# Patient Record
Sex: Male | Born: 1985 | Race: White | Hispanic: No | Marital: Single | Smoking: Current every day smoker
Health system: Southern US, Community
[De-identification: ages and names within clinical notes are randomized; demographics above are authoritative.]

## PROBLEM LIST (undated history)

## (undated) HISTORY — PX: OTHER SURGICAL HISTORY: SHX169

## (undated) HISTORY — PX: COLONOSCOPY: SHX174

## (undated) HISTORY — PX: APPENDECTOMY: SHX54

---

## 2008-05-08 ENCOUNTER — Emergency Department (HOSPITAL_COMMUNITY): Admission: EM | Admit: 2008-05-08 | Discharge: 2008-05-08 | Payer: Self-pay | Admitting: Emergency Medicine

## 2008-05-12 ENCOUNTER — Emergency Department (HOSPITAL_COMMUNITY): Admission: EM | Admit: 2008-05-12 | Discharge: 2008-05-12 | Payer: Self-pay | Admitting: Emergency Medicine

## 2010-08-21 IMAGING — CR DG CHEST 2V
3 series · 3 of 3 positions shown · non-contrast
Comparison: None

CLINICAL DATA: Chest pain, shortness of breath, smoker

CHEST - 2 VIEW

[view not recorded (1 of 3)]
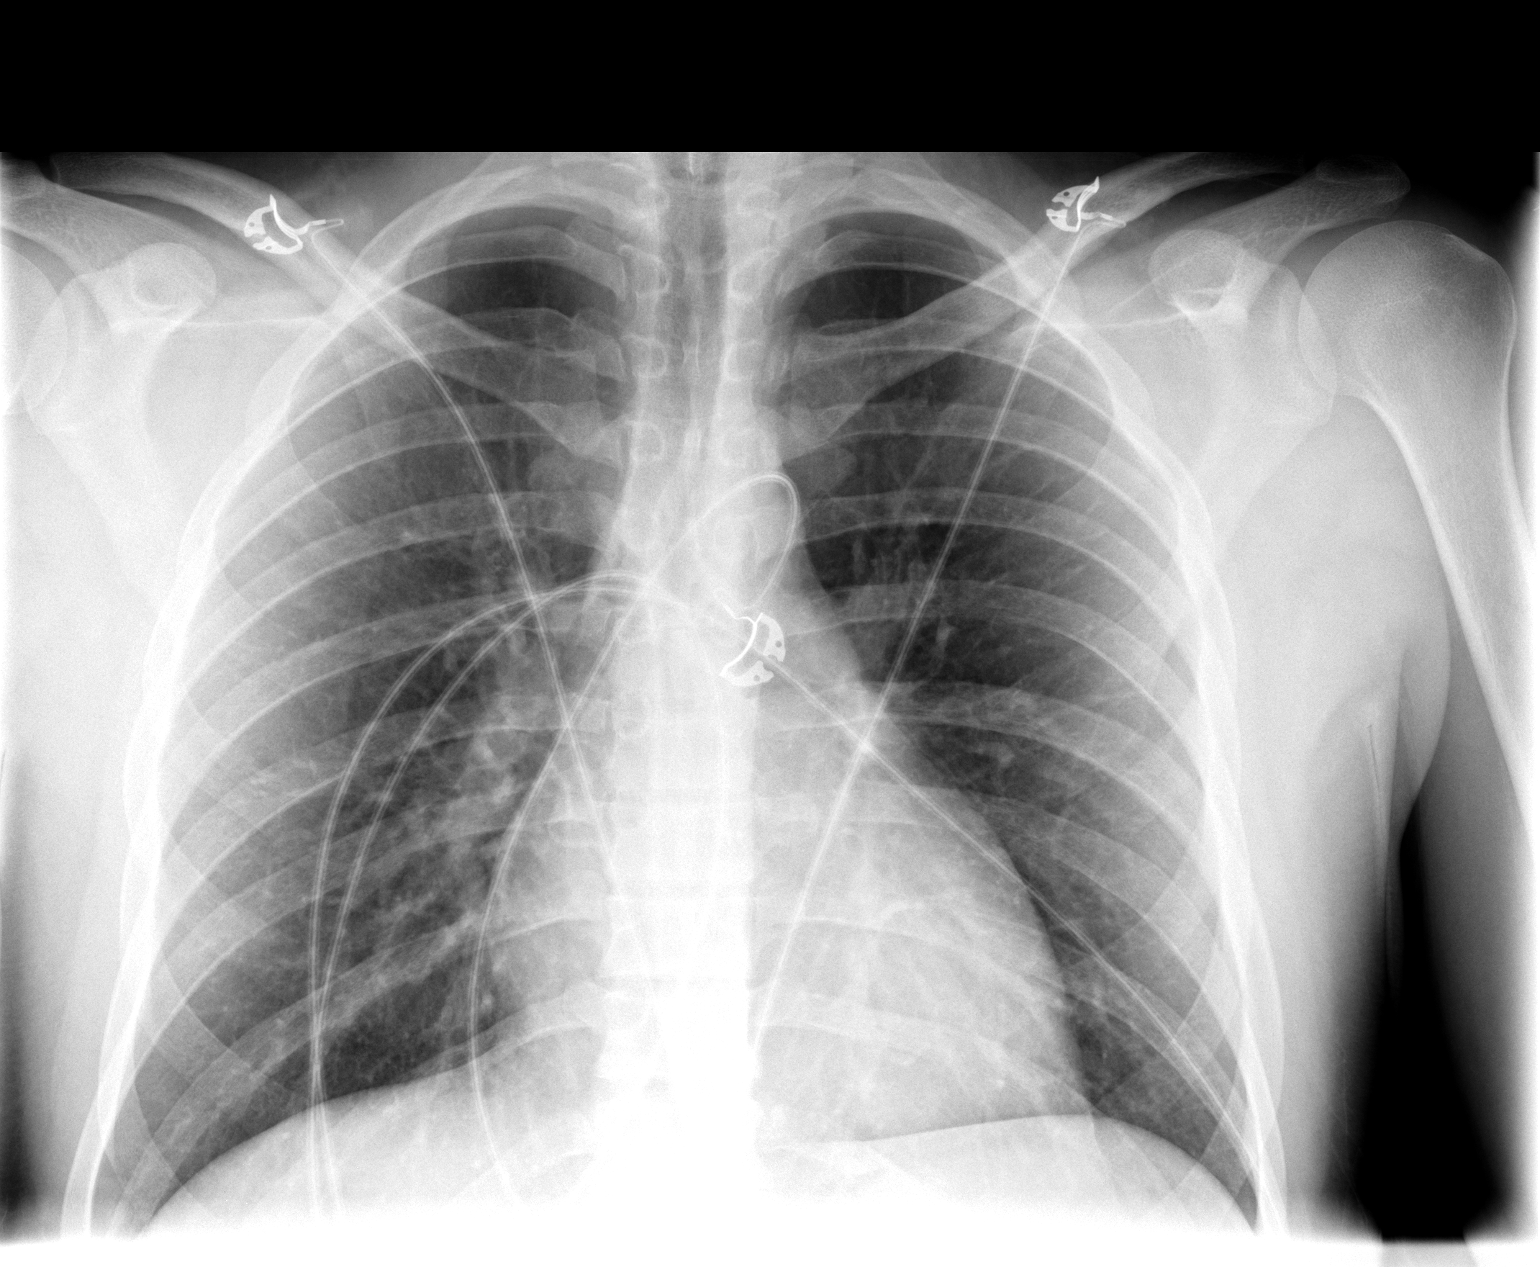

[view not recorded (2 of 3)]
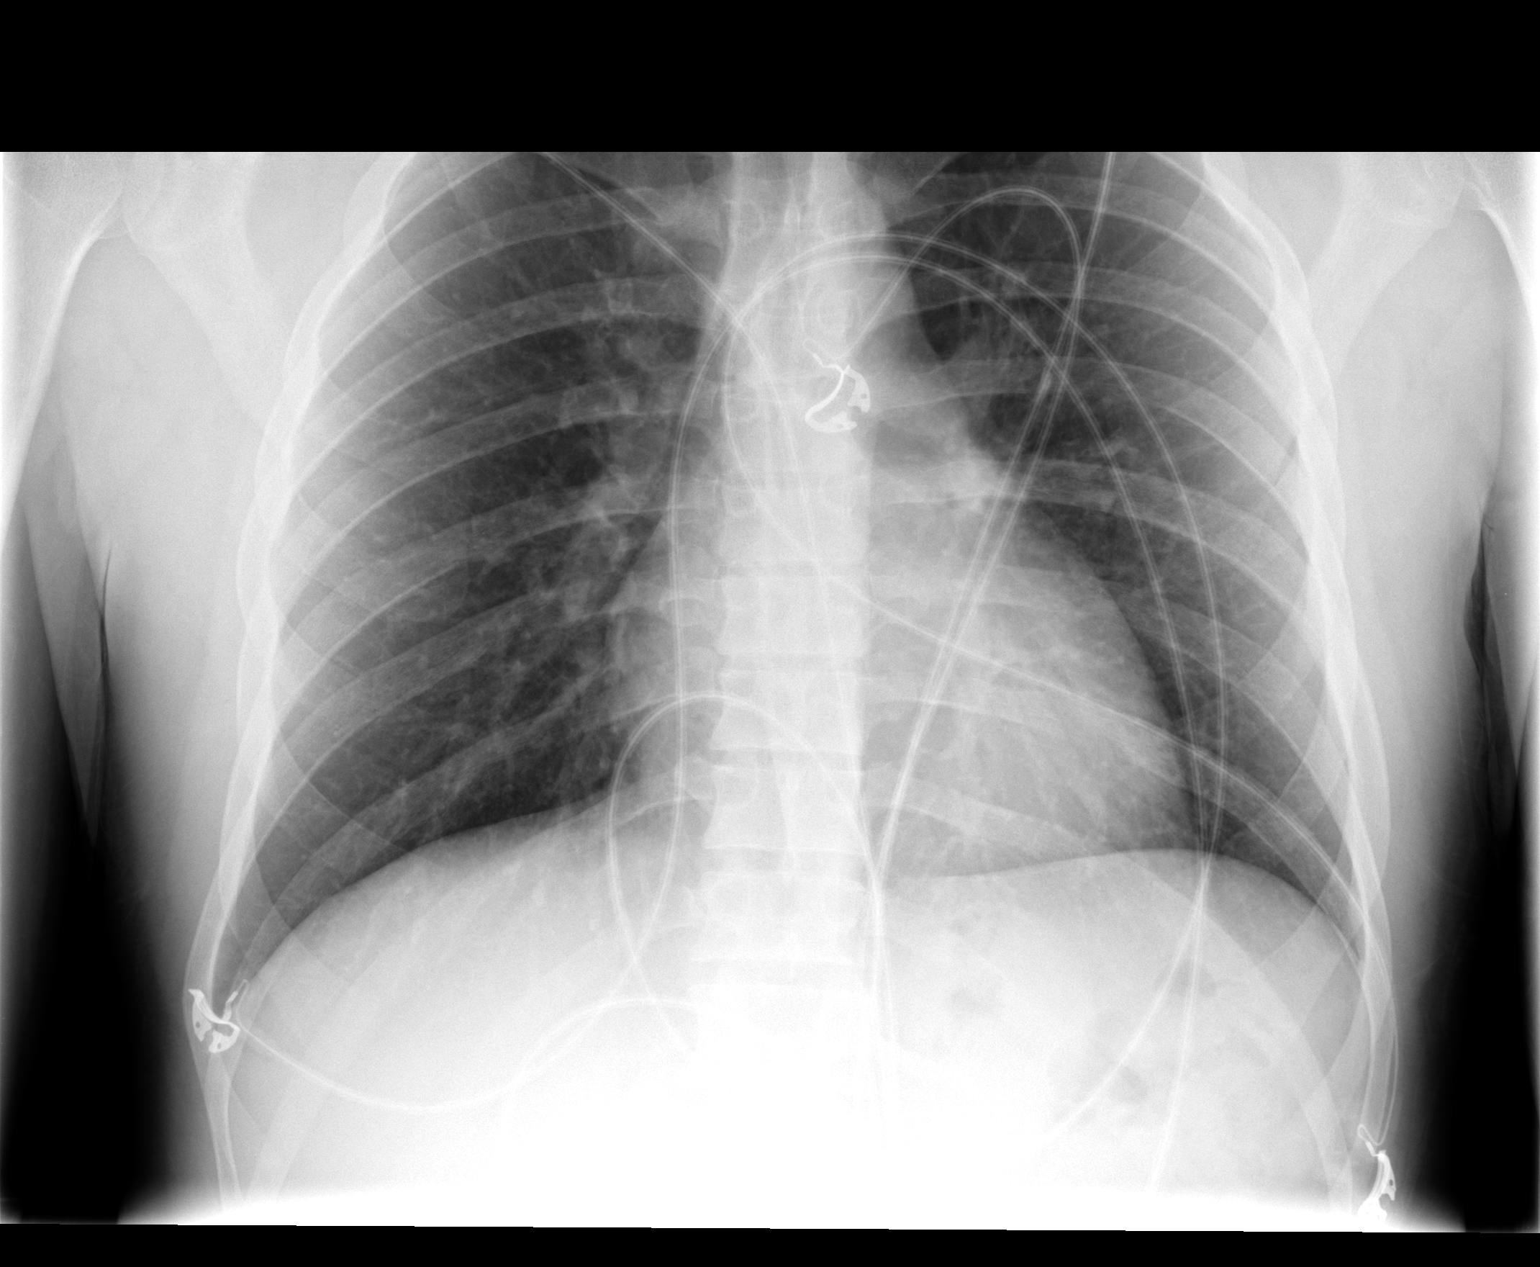

[view not recorded (3 of 3)]
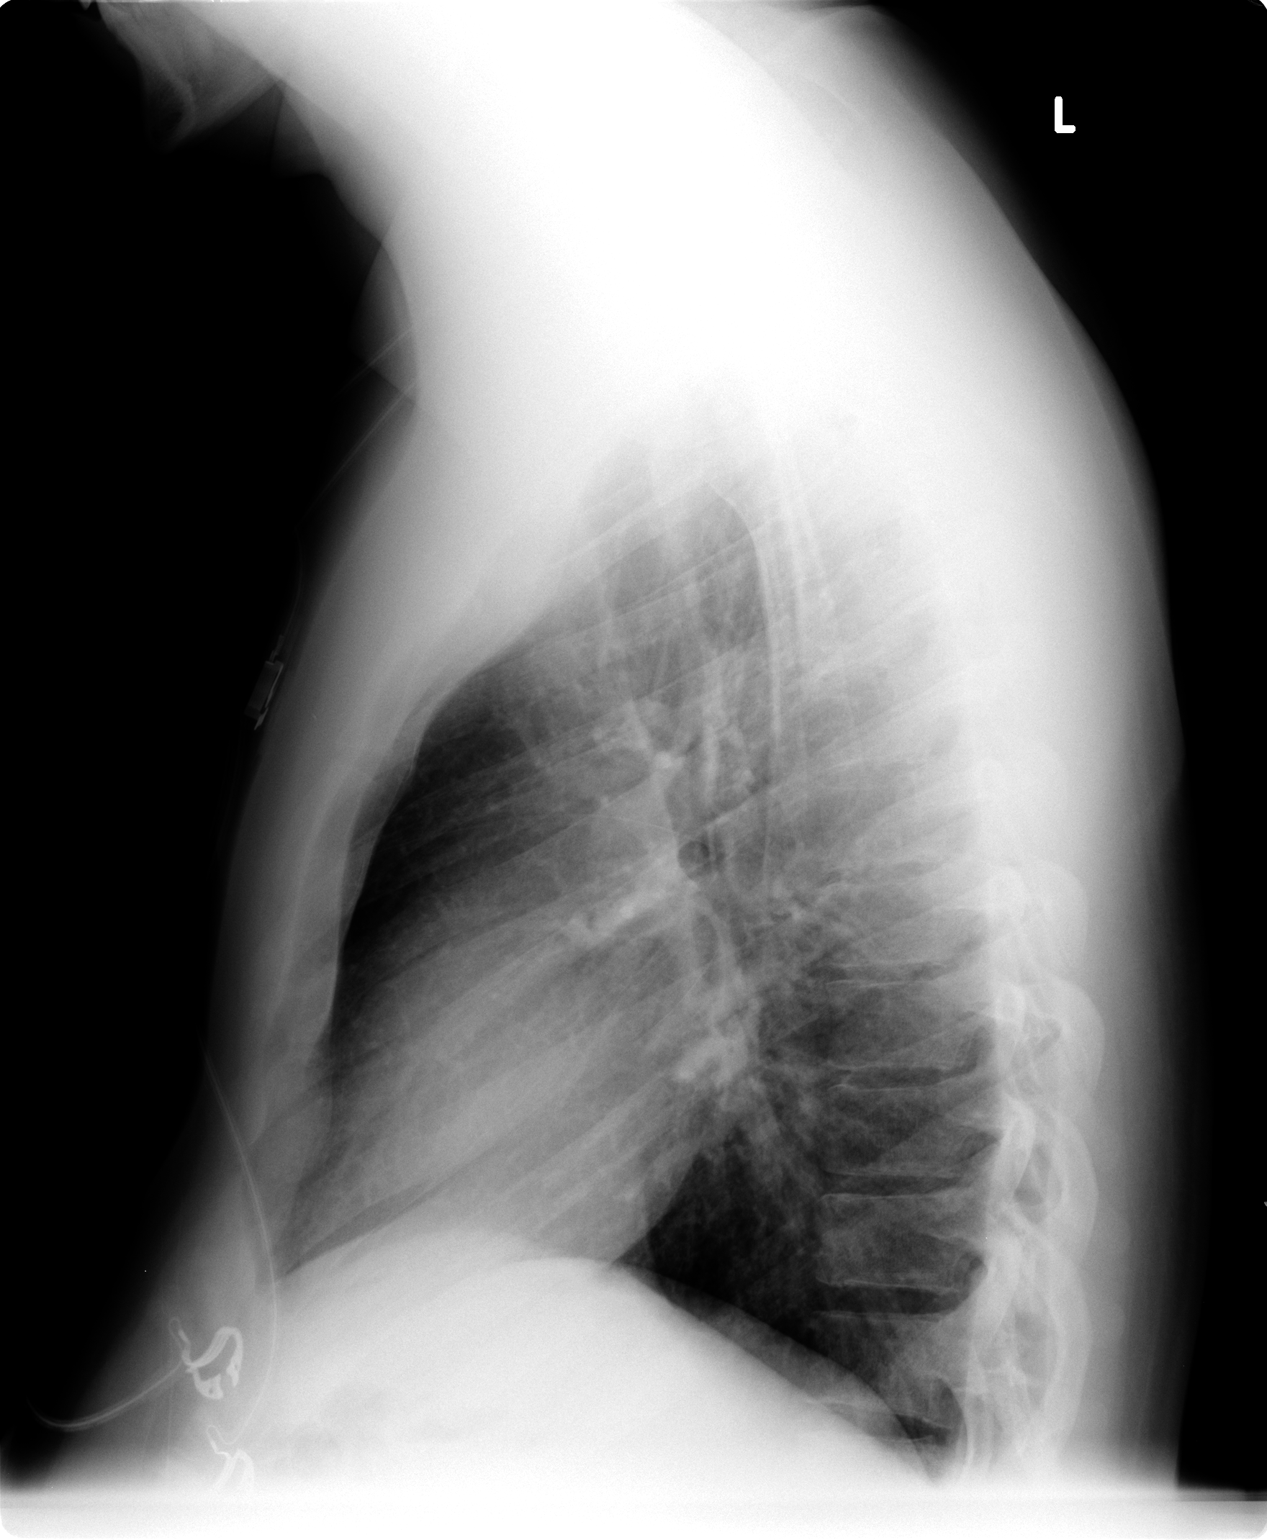

[3 of 3 positions shown; findings below may reference images not displayed]

FINDINGS: Upper normal heart size for age.
Normal mediastinal contours and pulmonary vascularity.
Lungs clear.
No pleural effusion or pneumothorax.
Bones unremarkable.
IMPRESSION: Upper normal heart size.
No acute abnormalities.

## 2012-05-10 ENCOUNTER — Encounter (HOSPITAL_COMMUNITY): Payer: Self-pay | Admitting: *Deleted

## 2012-05-10 ENCOUNTER — Emergency Department (HOSPITAL_COMMUNITY)
Admission: EM | Admit: 2012-05-10 | Discharge: 2012-05-10 | Disposition: A | Discharge status: Home / Self Care | Payer: Self-pay | Attending: Emergency Medicine | Admitting: Emergency Medicine

## 2012-05-10 DIAGNOSIS — M2550 Pain in unspecified joint: Secondary | ICD-10-CM | POA: Insufficient documentation

## 2012-05-10 DIAGNOSIS — K029 Dental caries, unspecified: Secondary | ICD-10-CM | POA: Insufficient documentation

## 2012-05-10 DIAGNOSIS — K0889 Other specified disorders of teeth and supporting structures: Secondary | ICD-10-CM

## 2012-05-10 DIAGNOSIS — F17200 Nicotine dependence, unspecified, uncomplicated: Secondary | ICD-10-CM | POA: Insufficient documentation

## 2012-05-10 DIAGNOSIS — K089 Disorder of teeth and supporting structures, unspecified: Secondary | ICD-10-CM | POA: Insufficient documentation

## 2012-05-10 DIAGNOSIS — R6884 Jaw pain: Secondary | ICD-10-CM | POA: Insufficient documentation

## 2012-05-10 DIAGNOSIS — Z8781 Personal history of (healed) traumatic fracture: Secondary | ICD-10-CM | POA: Insufficient documentation

## 2012-05-10 MED ORDER — AMOXICILLIN 500 MG PO CAPS: 500.0000 mg | ORAL_CAPSULE | Freq: Three times a day (TID) | ORAL | Status: DC

## 2012-05-10 MED ORDER — KETOROLAC TROMETHAMINE 10 MG PO TABS
10.0000 mg | ORAL_TABLET | Freq: Once | ORAL | Status: AC
  Administered 2012-05-10: 10 mg via ORAL
  Filled 2012-05-10: qty 1

## 2012-05-10 MED ORDER — MELOXICAM 7.5 MG PO TABS: ORAL_TABLET | ORAL | Status: DC

## 2012-05-10 MED ORDER — PENICILLIN V POTASSIUM 250 MG PO TABS
500.0000 mg | ORAL_TABLET | Freq: Once | ORAL | Status: AC
  Administered 2012-05-10: 500 mg via ORAL
  Filled 2012-05-10: qty 2

## 2012-05-10 MED ORDER — HYDROCODONE-ACETAMINOPHEN 5-325 MG PO TABS
2.0000 | ORAL_TABLET | Freq: Once | ORAL | Status: AC
  Administered 2012-05-10: 2 via ORAL
  Filled 2012-05-10: qty 2

## 2012-05-10 MED ORDER — ONDANSETRON HCL 4 MG PO TABS
4.0000 mg | ORAL_TABLET | Freq: Once | ORAL | Status: AC
  Administered 2012-05-10: 4 mg via ORAL
  Filled 2012-05-10: qty 1

## 2012-05-10 MED ORDER — HYDROCODONE-ACETAMINOPHEN 5-325 MG PO TABS: 1.0000 | ORAL_TABLET | ORAL | Status: DC | PRN

## 2012-05-10 NOTE — ED Provider Notes (Signed)
History     CSN: [id number]  Arrival date & time 05/10/12  2137   None     Chief Complaint  Patient presents with  . Dental Pain    (Consider location/radiation/quality/duration/timing/severity/associated sxs/prior treatment) Patient is a 27 y.o. male presenting with tooth pain. The history is provided by the patient.  Dental PainThe primary symptoms include mouth pain. Primary symptoms do not include shortness of breath or cough. The symptoms began 3 to 5 days ago. The symptoms are worsening. The symptoms occur frequently.  Additional symptoms include: gum swelling and jaw pain. Additional symptoms do not include: trouble swallowing and nosebleeds. Medical issues include: smoking. Medical issues do not include: chewing tobacco.    History reviewed. No pertinent past medical history.  Past Surgical History  Procedure Laterality Date  . Hand fractiure    . Appendectomy    . Colonoscopy      History reviewed. No pertinent family history.  History  Substance Use Topics  . Smoking status: Current Every Day Smoker    Types: Cigarettes  . Smokeless tobacco: Not on file  . Alcohol Use: No      Review of Systems  Constitutional: Negative for activity change.       All ROS Neg except as noted in HPI  HENT: Positive for dental problem. Negative for nosebleeds, trouble swallowing and neck pain.   Eyes: Negative for photophobia and discharge.  Respiratory: Negative for cough, shortness of breath and wheezing.   Cardiovascular: Negative for chest pain and palpitations.  Gastrointestinal: Negative for abdominal pain and blood in stool.  Genitourinary: Negative for dysuria, frequency and hematuria.  Musculoskeletal: Positive for arthralgias. Negative for back pain.  Skin: Negative.   Neurological: Negative for dizziness, seizures and speech difficulty.  Psychiatric/Behavioral: Negative for hallucinations and confusion.    Allergies  Review of patient's allergies indicates no  known allergies.  Home Medications  No current outpatient prescriptions on file.  BP 130/69  Pulse 79  Temp(Src) 98 F (36.7 C) (Oral)  Resp 18  Ht 6\' 1"  (1.854 m)  Wt 200 lb (90.719 kg)  BMI 26.39 kg/m2  SpO2 100%  Physical Exam  Nursing note and vitals reviewed. Constitutional: He is oriented to person, place, and time. He appears well-developed and well-nourished.  Non-toxic appearance.  HENT:  Head: Normocephalic.  Right Ear: Tympanic membrane and external ear normal.  Left Ear: Tympanic membrane and external ear normal.  Patient has multiple dental caries present. The first molar on the left side the upper jaw is decayed to the gum line. There is swelling around the gum in this area. The lower left second molar and first molar have deep cavities present. There is mild swelling of the gum on the side. There is no visible abscess of the upper or lower gum. The airway is patent. There is no swelling under the tongue.  Eyes: EOM and lids are normal. Pupils are equal, round, and reactive to light.  Neck: Normal range of motion. Neck supple. Carotid bruit is not present.  Cardiovascular: Normal rate, regular rhythm, normal heart sounds, intact distal pulses and normal pulses.   Pulmonary/Chest: Breath sounds normal. No respiratory distress.  Abdominal: Soft. Bowel sounds are normal. There is no tenderness. There is no guarding.  Musculoskeletal: Normal range of motion.  Lymphadenopathy:       Head (right side): No submandibular adenopathy present.       Head (left side): No submandibular adenopathy present.    He has no  cervical adenopathy.  Neurological: He is alert and oriented to person, place, and time. He has normal strength. No cranial nerve deficit or sensory deficit.  Skin: Skin is warm and dry.  Psychiatric: He has a normal mood and affect. His speech is normal.    ED Course  Procedures (including critical care time)  Labs Reviewed - No data to display No results  found.   No diagnosis found.    MDM  I have reviewed nursing notes, vital signs, and all appropriate lab and imaging results for this patient. Patient has multiple dental caries present. Vital signs are stable. Patient is given a Personnel officer. He is advised to see a dentist systems possible. Prescription for Amoxil, Norco #20 tablets, and Mobic.       Kathie Dike, PA-C 05/10/12 2241

## 2012-05-10 NOTE — ED Notes (Signed)
Pain rt side of face, says he has mult. cavities

## 2012-05-10 NOTE — ED Notes (Signed)
Alert, NAd, dental pain rt side of face.

## 2012-05-11 NOTE — ED Provider Notes (Signed)
Medical screening examination/treatment/procedure(s) were performed by non-physician practitioner and as supervising physician I was immediately available for consultation/collaboration.   Kordel Leavy L Zadia Uhde, MD 05/11/12 1550 

## 2012-05-28 ENCOUNTER — Encounter (HOSPITAL_COMMUNITY): Payer: Self-pay

## 2012-05-28 ENCOUNTER — Emergency Department (HOSPITAL_COMMUNITY): Payer: Self-pay

## 2012-05-28 ENCOUNTER — Emergency Department (HOSPITAL_COMMUNITY)
Admission: EM | Admit: 2012-05-28 | Discharge: 2012-05-28 | Disposition: A | Discharge status: Home / Self Care | Payer: Self-pay | Attending: Emergency Medicine | Admitting: Emergency Medicine

## 2012-05-28 DIAGNOSIS — R093 Abnormal sputum: Secondary | ICD-10-CM | POA: Insufficient documentation

## 2012-05-28 DIAGNOSIS — R059 Cough, unspecified: Secondary | ICD-10-CM | POA: Insufficient documentation

## 2012-05-28 DIAGNOSIS — B349 Viral infection, unspecified: Secondary | ICD-10-CM

## 2012-05-28 DIAGNOSIS — B9789 Other viral agents as the cause of diseases classified elsewhere: Secondary | ICD-10-CM | POA: Insufficient documentation

## 2012-05-28 DIAGNOSIS — R05 Cough: Secondary | ICD-10-CM | POA: Insufficient documentation

## 2012-05-28 DIAGNOSIS — F17200 Nicotine dependence, unspecified, uncomplicated: Secondary | ICD-10-CM | POA: Insufficient documentation

## 2012-05-28 DIAGNOSIS — R52 Pain, unspecified: Secondary | ICD-10-CM | POA: Insufficient documentation

## 2012-05-28 LAB — CBC WITH DIFFERENTIAL/PLATELET
Eosinophils Absolute: 0 10*3/uL (ref 0.0–0.7)
Hemoglobin: 13.8 g/dL (ref 13.0–17.0)
Lymphocytes Relative: 8 % — ABNORMAL LOW (ref 12–46)
Lymphs Abs: 1.3 10*3/uL (ref 0.7–4.0)
MCH: 30.5 pg (ref 26.0–34.0)
Monocytes Relative: 8 % (ref 3–12)
Neutrophils Relative %: 84 % — ABNORMAL HIGH (ref 43–77)
RBC: 4.53 MIL/uL (ref 4.22–5.81)
WBC: 16.5 10*3/uL — ABNORMAL HIGH (ref 4.0–10.5)

## 2012-05-28 LAB — URINALYSIS, ROUTINE W REFLEX MICROSCOPIC
Bilirubin Urine: NEGATIVE
Glucose, UA: NEGATIVE mg/dL
Hgb urine dipstick: NEGATIVE
Specific Gravity, Urine: >1.03 — ABNORMAL HIGH (ref 1.005–1.030)
pH: 5.5 (ref 5.0–8.0)

## 2012-05-28 LAB — BASIC METABOLIC PANEL
BUN: 17 mg/dL (ref 6–23)
CO2: 23 mEq/L (ref 19–32)
Chloride: 105 mEq/L (ref 96–112)
GFR calc non Af Amer: >90 mL/min (ref >90)
Glucose, Bld: 125 mg/dL — ABNORMAL HIGH (ref 70–99)
Potassium: 3.7 mEq/L (ref 3.5–5.1)

## 2012-05-28 MED ORDER — ONDANSETRON HCL 4 MG/2ML IJ SOLN
4.0000 mg | Freq: Once | INTRAMUSCULAR | Status: AC
  Administered 2012-05-28: 4 mg via INTRAVENOUS
  Filled 2012-05-28: qty 2

## 2012-05-28 MED ORDER — PROMETHAZINE HCL 25 MG PO TABS: 25.0000 mg | ORAL_TABLET | Freq: Four times a day (QID) | ORAL | Status: DC | PRN

## 2012-05-28 MED ORDER — MORPHINE SULFATE 4 MG/ML IJ SOLN
4.0000 mg | Freq: Once | INTRAMUSCULAR | Status: AC
  Administered 2012-05-28: 4 mg via INTRAVENOUS
  Filled 2012-05-28: qty 1

## 2012-05-28 MED ORDER — TRAMADOL HCL 50 MG PO TABS: 50.0000 mg | ORAL_TABLET | Freq: Four times a day (QID) | ORAL | Status: DC | PRN

## 2012-05-28 MED ORDER — ACETAMINOPHEN 500 MG PO TABS
1000.0000 mg | ORAL_TABLET | Freq: Once | ORAL | Status: AC
  Administered 2012-05-28: 1000 mg via ORAL
  Filled 2012-05-28: qty 2

## 2012-05-28 MED ORDER — SODIUM CHLORIDE 0.9 % IV BOLUS (SEPSIS)
1000.0000 mL | Freq: Once | INTRAVENOUS | Status: AC
  Administered 2012-05-28: 1000 mL via INTRAVENOUS

## 2012-05-28 NOTE — ED Provider Notes (Signed)
History     This chart was scribed for Christopher Hutching, MD, MD by Smitty Pluck, ED Scribe. The patient was seen in room APA08/AP[facility]08 and the patient's care was started at 9:24 AM.   CSN: [id number]  Arrival date & time 05/28/12  4098     Chief Complaint  Patient presents with  . Fever  . Cough  . Generalized Body Aches     The history is provided by the patient and medical records. No language interpreter was used.   AKING KLABUNDE is a 27 y.o. male who presents to the Emergency Department complaining of constant, moderate generalized body aches and chills onset 1 day ago. He states he has taken 2 ibuprofen without relief. He states he has had sick contact with friend. He reports productive cough with green sputum, fever (current ED temperature is 99.8). Pt denies nausea, vomiting, diarrhea, weakness, SOB and any other pain.   History reviewed. No pertinent past medical history.  Past Surgical History  Procedure Laterality Date  . Hand fractiure    . Appendectomy    . Colonoscopy      No family history on file.  History  Substance Use Topics  . Smoking status: Current Every Day Smoker    Types: Cigarettes  . Smokeless tobacco: Not on file  . Alcohol Use: No      Review of Systems 10 Systems reviewed and all are negative for acute change except as noted in the HPI.   Allergies  Review of patient's allergies indicates no known allergies.  Home Medications   Current Outpatient Rx  Name  Route  Sig  Dispense  Refill  . amoxicillin (AMOXIL) 500 MG capsule   Oral   Take 1 capsule (500 mg total) by mouth 3 (three) times daily.   21 capsule   0   . HYDROcodone-acetaminophen (NORCO/VICODIN) 5-325 MG per tablet   Oral   Take 1 tablet by mouth every 4 (four) hours as needed for pain.   20 tablet   0   . meloxicam (MOBIC) 7.5 MG tablet      1 po bid with food   14 tablet   0     BP 135/65  Pulse 87  Temp(Src) 99.8 F (37.7 C) (Oral)  Resp 16  SpO2  97%  Physical Exam  Nursing note and vitals reviewed. Constitutional: He is oriented to person, place, and time. He appears well-developed and well-nourished.  HENT:  Head: Normocephalic and atraumatic.  Eyes: Conjunctivae and EOM are normal. Pupils are equal, round, and reactive to light.  Neck: Normal range of motion. Neck supple.  Cardiovascular: Normal rate, regular rhythm and normal heart sounds.   Pulmonary/Chest: Effort normal and breath sounds normal.  Abdominal: Soft. Bowel sounds are normal.  Musculoskeletal: Normal range of motion.  Neurological: He is alert and oriented to person, place, and time.  Skin: Skin is warm and dry.  Psychiatric: He has a normal mood and affect.    ED Course  Procedures (including critical care time) DIAGNOSTIC STUDIES: Oxygen Saturation is 97% on room air, normal by my interpretation.    COORDINATION OF CARE: 9:28 AM Discussed ED treatment with pt and pt agrees to IV fluids and labs. 9:35 AM Ordered:  Medications  sodium chloride 0.9 % bolus 1,000 mL (not administered)  sodium chloride 0.9 % bolus 1,000 mL (1,000 mLs Intravenous New Bag/Given 05/28/12 0955)  ondansetron (ZOFRAN) injection 4 mg (4 mg Intravenous Given 05/28/12 0955)  acetaminophen (TYLENOL)  tablet 1,000 mg (1,000 mg Oral Given 05/28/12 0956)   Results for orders placed during the hospital encounter of 05/28/12  CBC WITH DIFFERENTIAL      Result Value Range   WBC 16.5 (*) 4.0 - 10.5 K/uL   RBC 4.53  4.22 - 5.81 MIL/uL   Hemoglobin 13.8  13.0 - 17.0 g/dL   HCT 40.9  81.1 - 91.4 %   MCV 89.0  78.0 - 100.0 fL   MCH 30.5  26.0 - 34.0 pg   MCHC 34.2  30.0 - 36.0 g/dL   RDW 78.2  95.6 - 21.3 %   Platelets 177  150 - 400 K/uL   Neutrophils Relative 84 (*) 43 - 77 %   Neutro Abs 13.8 (*) 1.7 - 7.7 K/uL   Lymphocytes Relative 8 (*) 12 - 46 %   Lymphs Abs 1.3  0.7 - 4.0 K/uL   Monocytes Relative 8  3 - 12 %   Monocytes Absolute 1.3 (*) 0.1 - 1.0 K/uL   Eosinophils Relative 0  0  - 5 %   Eosinophils Absolute 0.0  0.0 - 0.7 K/uL   Basophils Relative 0  0 - 1 %   Basophils Absolute 0.0  0.0 - 0.1 K/uL  BASIC METABOLIC PANEL      Result Value Range   Sodium 138  135 - 145 mEq/L   Potassium 3.7  3.5 - 5.1 mEq/L   Chloride 105  96 - 112 mEq/L   CO2 23  19 - 32 mEq/L   Glucose, Bld 125 (*) 70 - 99 mg/dL   BUN 17  6 - 23 mg/dL   Creatinine, Ser 0.86  0.50 - 1.35 mg/dL   Calcium 9.2  8.4 - 57.8 mg/dL   GFR calc non Af Amer >90  >90 mL/min   GFR calc Af Amer >90  >90 mL/min    Dg Chest 2 View  05/28/2012  *RADIOLOGY REPORT*  Clinical Data: Fever, cough, body ache  CHEST - 2 VIEW  Comparison: 05/08/2008  Findings: An cardiomediastinal silhouette is stable.  No acute infiltrate or pulmonary edema.  Mild perihilar increased bronchial markings suspicious for bronchitic changes.  Bony thorax is stable.  IMPRESSION: Mild perihilar increased bronchial markings suspicious for bronchitic changes.  No acute infiltrate or pulmonary edema.   Original Report Authenticated By: Natasha Mead, M.D.      No diagnosis found.    MDM  History and physical consistent with viral syndrome. Patient feels much better after IV hydration.  Discharge meds Phenergan 25 mg #15 and Ultram #15      I personally performed the services described in this documentation, which was scribed in my presence. The recorded information has been reviewed and is accurate.    Christopher Hutching, MD 05/28/12 1319

## 2012-05-28 NOTE — ED Notes (Signed)
Pt reports fever, body aches, and cough that began last night.  Pt denies any n/v/d.

## 2012-11-13 ENCOUNTER — Encounter (HOSPITAL_COMMUNITY): Payer: Self-pay | Admitting: Emergency Medicine

## 2012-11-13 ENCOUNTER — Emergency Department (HOSPITAL_COMMUNITY)
Admission: EM | Admit: 2012-11-13 | Discharge: 2012-11-13 | Disposition: A | Discharge status: Home / Self Care | Payer: Self-pay | Attending: Emergency Medicine | Admitting: Emergency Medicine

## 2012-11-13 DIAGNOSIS — F17200 Nicotine dependence, unspecified, uncomplicated: Secondary | ICD-10-CM | POA: Insufficient documentation

## 2012-11-13 DIAGNOSIS — Z792 Long term (current) use of antibiotics: Secondary | ICD-10-CM | POA: Insufficient documentation

## 2012-11-13 DIAGNOSIS — K0889 Other specified disorders of teeth and supporting structures: Secondary | ICD-10-CM

## 2012-11-13 DIAGNOSIS — K089 Disorder of teeth and supporting structures, unspecified: Secondary | ICD-10-CM | POA: Insufficient documentation

## 2012-11-13 DIAGNOSIS — K029 Dental caries, unspecified: Secondary | ICD-10-CM | POA: Insufficient documentation

## 2012-11-13 MED ORDER — PENICILLIN V POTASSIUM 500 MG PO TABS: 500.0000 mg | ORAL_TABLET | Freq: Four times a day (QID) | ORAL | Status: DC

## 2012-11-13 MED ORDER — IBUPROFEN 600 MG PO TABS: 600.0000 mg | ORAL_TABLET | Freq: Three times a day (TID) | ORAL | Status: DC | PRN

## 2012-11-13 MED ORDER — IBUPROFEN 800 MG PO TABS
800.0000 mg | ORAL_TABLET | Freq: Once | ORAL | Status: AC
  Administered 2012-11-13: 800 mg via ORAL
  Filled 2012-11-13: qty 1

## 2012-11-13 MED ORDER — BUPIVACAINE HCL (PF) 0.25 % IJ SOLN
30.0000 mL | Freq: Once | INTRAMUSCULAR | Status: DC
  Filled 2012-11-13: qty 30

## 2012-11-13 MED ORDER — PENICILLIN V POTASSIUM 250 MG PO TABS
500.0000 mg | ORAL_TABLET | Freq: Once | ORAL | Status: AC
  Administered 2012-11-13: 500 mg via ORAL
  Filled 2012-11-13: qty 2

## 2012-11-13 NOTE — ED Notes (Signed)
Patient c/o right upper and lower back tooth pain.

## 2012-11-13 NOTE — ED Provider Notes (Signed)
CSN: [id number]     Arrival date & time 11/13/12  0455 History   First MD Initiated Contact with Patient 11/13/12 0457     Chief Complaint  Patient presents with  . Dental Pain    HPI Patient complains of right second molar pain has been worsening over the past 24 hours.  Reports his pain is mild to moderate it radiates up towards his right ear in his right TMJ.  His pain is similar to prior dental pain he has had before.  No other complaints.  No fevers or chills.  No difficulty breathing or swallowing.   History reviewed. No pertinent past medical history. Past Surgical History  Procedure Laterality Date  . Hand fractiure    . Appendectomy    . Colonoscopy     No family history on file. History  Substance Use Topics  . Smoking status: Current Every Day Smoker    Types: Cigarettes  . Smokeless tobacco: Not on file  . Alcohol Use: No    Review of Systems  All other systems reviewed and are negative.    Allergies  Review of patient's allergies indicates no known allergies.  Home Medications   Current Outpatient Rx  Name  Route  Sig  Dispense  Refill  . ibuprofen (ADVIL,MOTRIN) 200 MG tablet   Oral   Take 400 mg by mouth every 6 (six) hours as needed for pain.         Marland Kitchen ibuprofen (ADVIL,MOTRIN) 600 MG tablet   Oral   Take 1 tablet (600 mg total) by mouth every 8 (eight) hours as needed for pain.   15 tablet   0   . penicillin v potassium (VEETID) 500 MG tablet   Oral   Take 1 tablet (500 mg total) by mouth 4 (four) times daily.   28 tablet   0   . promethazine (PHENERGAN) 25 MG tablet   Oral   Take 1 tablet (25 mg total) by mouth every 6 (six) hours as needed for nausea.   15 tablet   0   . traMADol (ULTRAM) 50 MG tablet   Oral   Take 1 tablet (50 mg total) by mouth every 6 (six) hours as needed for pain.   15 tablet   0    BP 135/93  Pulse 78  Temp(Src) 97.5 F (36.4 C) (Oral)  Resp 20  Ht 6\' 1"  (1.854 m)  Wt 185 lb (83.915 kg)  BMI 24.41  kg/m2  SpO2 100% Physical Exam  Nursing note and vitals reviewed. Constitutional: He is oriented to person, place, and time. He appears well-developed and well-nourished.  HENT:  Head: Normocephalic and atraumatic.    Patient with tenderness and obvious dental decay around his second molar on his right upper side.  No facial swelling.  No trismus or malocclusion.  Posterior pharynx is normal in appearance with a midline uvula.  Eyes: EOM are normal.  Neck: Normal range of motion.  Cardiovascular: Normal rate, regular rhythm, normal heart sounds and intact distal pulses.   Pulmonary/Chest: Effort normal and breath sounds normal.  Abdominal: Soft.  Musculoskeletal: Normal range of motion.  Neurological: He is alert and oriented to person, place, and time.  Skin: Skin is warm and dry.  Psychiatric: He has a normal mood and affect. Judgment normal.    ED Course  Procedures (including critical care time)  NERVE BLOCK Performed by: Lyanne Co Consent: Verbal consent obtained. Required items: required blood products, implants, devices, and special  equipment available Time out: Immediately prior to procedure a "time out" was called to verify the correct patient, procedure, equipment, support staff and site/side marked as required. Indication: Dental pain  Nerve block body site:  Right infraorbital nerve block Preparation: Patient was prepped and draped in the usual sterile fashion. Needle gauge: 24 G Location technique: anatomical landmarks Local anesthetic: Bupivacaine 0.25% without epi  Anesthetic total: 4 ml Outcome: pain improved Patient tolerance: Patient tolerated the procedure well with no immediate complications.    Labs Review Labs Reviewed - No data to display Imaging Review No results found.  MDM   1. Pain, dental    Pain improved.  Home with antibiotics.    Lyanne Co, MD 11/13/12 [id number]

## 2015-05-13 ENCOUNTER — Emergency Department (HOSPITAL_COMMUNITY)
Admission: EM | Admit: 2015-05-13 | Discharge: 2015-05-13 | Disposition: A | Discharge status: Home / Self Care | Payer: Self-pay | Attending: Emergency Medicine | Admitting: Emergency Medicine

## 2015-05-13 ENCOUNTER — Encounter (HOSPITAL_COMMUNITY): Payer: Self-pay | Admitting: *Deleted

## 2015-05-13 DIAGNOSIS — R112 Nausea with vomiting, unspecified: Secondary | ICD-10-CM | POA: Insufficient documentation

## 2015-05-13 DIAGNOSIS — K089 Disorder of teeth and supporting structures, unspecified: Secondary | ICD-10-CM | POA: Insufficient documentation

## 2015-05-13 DIAGNOSIS — K0889 Other specified disorders of teeth and supporting structures: Secondary | ICD-10-CM | POA: Insufficient documentation

## 2015-05-13 DIAGNOSIS — K029 Dental caries, unspecified: Secondary | ICD-10-CM | POA: Insufficient documentation

## 2015-05-13 DIAGNOSIS — R55 Syncope and collapse: Secondary | ICD-10-CM | POA: Insufficient documentation

## 2015-05-13 DIAGNOSIS — F17210 Nicotine dependence, cigarettes, uncomplicated: Secondary | ICD-10-CM | POA: Insufficient documentation

## 2015-05-13 LAB — COMPREHENSIVE METABOLIC PANEL
ALT: 26 U/L (ref 17–63)
ANION GAP: 10 (ref 5–15)
AST: 28 U/L (ref 15–41)
Albumin: 4.1 g/dL (ref 3.5–5.0)
Alkaline Phosphatase: 62 U/L (ref 38–126)
BUN: 17 mg/dL (ref 6–20)
CHLORIDE: 109 mmol/L (ref 101–111)
CO2: 21 mmol/L — AB (ref 22–32)
Calcium: 9.4 mg/dL (ref 8.9–10.3)
Creatinine, Ser: 0.98 mg/dL (ref 0.61–1.24)
Glucose, Bld: 138 mg/dL — ABNORMAL HIGH (ref 65–99)
POTASSIUM: 3.9 mmol/L (ref 3.5–5.1)
SODIUM: 140 mmol/L (ref 135–145)
Total Bilirubin: 0.4 mg/dL (ref 0.3–1.2)
Total Protein: 7 g/dL (ref 6.5–8.1)

## 2015-05-13 LAB — LIPASE, BLOOD: LIPASE: 27 U/L (ref 11–51)

## 2015-05-13 LAB — CBC
HEMATOCRIT: 44.5 % (ref 39.0–52.0)
HEMOGLOBIN: 15.4 g/dL (ref 13.0–17.0)
MCH: 30.7 pg (ref 26.0–34.0)
MCHC: 34.6 g/dL (ref 30.0–36.0)
MCV: 88.6 fL (ref 78.0–100.0)
PLATELETS: 200 10*3/uL (ref 150–400)
RBC: 5.02 MIL/uL (ref 4.22–5.81)
RDW: 13.2 % (ref 11.5–15.5)
WBC: 6.7 10*3/uL (ref 4.0–10.5)

## 2015-05-13 MED ORDER — AMOXICILLIN-POT CLAVULANATE 875-125 MG PO TABS
1.0000 | ORAL_TABLET | Freq: Once | ORAL | Status: AC
  Administered 2015-05-13: 1 via ORAL
  Filled 2015-05-13: qty 1

## 2015-05-13 MED ORDER — KETOROLAC TROMETHAMINE 15 MG/ML IJ SOLN
30.0000 mg | Freq: Once | INTRAMUSCULAR | Status: AC
  Administered 2015-05-13: 30 mg via INTRAMUSCULAR
  Filled 2015-05-13: qty 2

## 2015-05-13 MED ORDER — OXYCODONE-ACETAMINOPHEN 5-325 MG PO TABS
1.0000 | ORAL_TABLET | Freq: Once | ORAL | Status: AC
  Administered 2015-05-13: 1 via ORAL

## 2015-05-13 MED ORDER — ONDANSETRON 4 MG PO TBDP
4.0000 mg | ORAL_TABLET | Freq: Once | ORAL | Status: AC
  Administered 2015-05-13: 4 mg via ORAL
  Filled 2015-05-13: qty 1

## 2015-05-13 MED ORDER — OXYCODONE-ACETAMINOPHEN 5-325 MG PO TABS
ORAL_TABLET | ORAL | Status: AC
  Filled 2015-05-13: qty 1

## 2015-05-13 NOTE — ED Notes (Addendum)
Per family- pt reports that pt has a dental abscess and was scheduled to have his teeth pulled tomorrow. Pt was reported to have a syncopal episode and vomittted x1 while they were going home. Pt alert at triage.

## 2015-05-13 NOTE — ED Provider Notes (Signed)
CSN: [id number]     Arrival date & time 05/13/15  1528 History   First MD Initiated Contact with Patient 05/13/15 1705     Chief Complaint  Patient presents with  . Dental Pain  . Emesis     (Consider location/radiation/quality/duration/timing/severity/associated sxs/prior Treatment) Patient is a 30 y.o. male presenting with tooth pain. The history is provided by the patient and a parent (both parents).  Dental Pain Location:  Upper Upper teeth location: upper left incisor. Quality:  Throbbing Severity:  Severe Onset quality:  Gradual Duration: pain for several weeks but worsened today. Timing:  Constant Progression:  Worsening Context: dental caries   Context: not trauma   Previous work-up:  Dental exam (is scheduled to have tooth pulled tomorrow) Relieved by:  Nothing Worsened by:  Hot food/drink and cold food/drink Associated symptoms: no drooling, no facial swelling, no fever, no gum swelling, no headaches and no trismus   Risk factors: no immunosuppression     History reviewed. No pertinent past medical history. Past Surgical History  Procedure Laterality Date  . Hand fractiure    . Appendectomy    . Colonoscopy     No family history on file. Social History  Substance Use Topics  . Smoking status: Current Every Day Smoker    Types: Cigarettes  . Smokeless tobacco: None  . Alcohol Use: No    Review of Systems  Constitutional: Negative for fever, diaphoresis, activity change and appetite change.  HENT: Positive for dental problem. Negative for drooling, ear discharge, ear pain, facial swelling, hearing loss, nosebleeds, sore throat, tinnitus, trouble swallowing and voice change.   Eyes: Negative for pain, redness and visual disturbance.  Respiratory: Negative for chest tightness, shortness of breath and wheezing.   Cardiovascular: Negative for chest pain, palpitations and leg swelling.  Gastrointestinal: Positive for nausea and vomiting. Negative for abdominal  pain, diarrhea, constipation and abdominal distention.  Endocrine: Negative.   Genitourinary: Negative.  Negative for dysuria, decreased urine volume, scrotal swelling and testicular pain.  Musculoskeletal: Negative for myalgias, back pain and gait problem.  Skin: Negative.  Negative for rash.  Neurological: Positive for syncope ("due to pain"). Negative for dizziness, tremors, weakness and headaches.  Psychiatric/Behavioral: Negative for suicidal ideas, hallucinations and self-injury. The patient is not nervous/anxious.       Allergies  Review of patient's allergies indicates no known allergies.  Home Medications   Prior to Admission medications   Medication Sig Start Date End Date Taking? Authorizing Provider  ibuprofen (ADVIL,MOTRIN) 200 MG tablet Take 400 mg by mouth every 6 (six) hours as needed for pain.    Historical Provider, MD  ibuprofen (ADVIL,MOTRIN) 600 MG tablet Take 1 tablet (600 mg total) by mouth every 8 (eight) hours as needed for pain. 11/13/12   Azalia BilisKevin Campos, MD  penicillin v potassium (VEETID) 500 MG tablet Take 1 tablet (500 mg total) by mouth 4 (four) times daily. 11/13/12   Azalia BilisKevin Campos, MD  promethazine (PHENERGAN) 25 MG tablet Take 1 tablet (25 mg total) by mouth every 6 (six) hours as needed for nausea. 05/28/12   Donnetta HutchingBrian Cook, MD  traMADol (ULTRAM) 50 MG tablet Take 1 tablet (50 mg total) by mouth every 6 (six) hours as needed for pain. 05/28/12   Donnetta HutchingBrian Cook, MD   BP 157/108 mmHg  Pulse 78  Temp(Src) 97.5 F (36.4 C) (Oral)  Resp 18  SpO2 98% Physical Exam  Constitutional: He is oriented to person, place, and time. He appears well-developed and well-nourished.  He appears distressed (in minor distress from pain).  HENT:  Head: Normocephalic and atraumatic.  Right Ear: External ear normal.  Left Ear: External ear normal.  Mouth/Throat: Uvula is midline and oropharynx is clear and moist. No trismus in the jaw. Dental caries present. No oropharyngeal exudate or  tonsillar abscesses.  Poor dentition. Severe decay to left upper incisor with no pus drainage. No fluctuance of floor of mouth. Airway patent. No trismus. No signs of ludwigs angina.   Eyes: Conjunctivae and EOM are normal. Pupils are equal, round, and reactive to light. No scleral icterus.  Neck: Normal range of motion. Neck supple. No JVD present. No tracheal deviation present. No thyromegaly present.  Cardiovascular: Normal rate, regular rhythm and intact distal pulses.   Pulmonary/Chest: Effort normal and breath sounds normal. No stridor. No respiratory distress. He has no wheezes. He has no rales.  Normal work of breathing. Speaking in full sentences  Abdominal: Soft. He exhibits no distension. There is no tenderness. There is no rebound and no guarding.  Musculoskeletal: Normal range of motion. He exhibits no edema or tenderness.  Neurological: He is alert and oriented to person, place, and time. No cranial nerve deficit. He exhibits normal muscle tone. Coordination normal.  GCS 15. 5/5 strength in all 4 extremities. Normal Gait.   Skin: Skin is warm and dry. No rash noted. He is not diaphoretic.  Psychiatric: He has a normal mood and affect. His behavior is normal.  Nursing note and vitals reviewed.   ED Course  Procedures (including critical care time) Labs Review Labs Reviewed  COMPREHENSIVE METABOLIC PANEL - Abnormal; Notable for the following:    CO2 21 (*)    Glucose, Bld 138 (*)    All other components within normal limits  LIPASE, BLOOD  CBC    Imaging Review No results found. I have personally reviewed and evaluated these images and lab results as part of my medical decision-making.   EKG Interpretation None      MDM   Final diagnoses:  Pain, dental    The patient is a 30 year old male who presents with left upper dental pain. Has been worked up by dentistry in the past and the patient has an appointment to pull a left upper incisor tomorrow. He presents  today due to severe worsening of his previous dental pain with a brief syncopal episode "due to pain". No fever or signs of systemic infection. The patient is noted to be afebrile and hemodynamically stable. No neurologic deficits on exam. White blood cell count normal. No signs of sepsis. Exam as above shown poor dentition but no signs of trismus fluctuance airway compromise or deep space infection. Do not suspect lumbar disc angina. Do not suspect deep space abscess. The patient is appropriate for outpatient antibiotic therapy at home and that she follow up tomorrow for definitive management. The patient reports he already has a prescription for Augmentin from his dentist. He is given a dose here as he is behind on his regimen. The patient is given 2 doses of pain medication while in the emergency department with moderate improvement in pain. He is offered a dental block for further relief but declines this procedure. Standard ED return precautions given.  Patient seen with attending, Dr. Denton Lank, who oversaw clinical decision making.     Lula Olszewski, MD 05/13/15 1741  Cathren Laine, MD 05/13/15 2128

## 2015-10-03 ENCOUNTER — Emergency Department (HOSPITAL_COMMUNITY)
Admission: EM | Admit: 2015-10-03 | Discharge: 2015-10-04 | Disposition: A | Discharge status: Home / Self Care | Payer: Self-pay | Attending: Emergency Medicine | Admitting: Emergency Medicine

## 2015-10-03 ENCOUNTER — Emergency Department (HOSPITAL_COMMUNITY): Payer: Self-pay

## 2015-10-03 ENCOUNTER — Encounter (HOSPITAL_COMMUNITY): Payer: Self-pay | Admitting: *Deleted

## 2015-10-03 DIAGNOSIS — Z79899 Other long term (current) drug therapy: Secondary | ICD-10-CM | POA: Insufficient documentation

## 2015-10-03 DIAGNOSIS — M62838 Other muscle spasm: Secondary | ICD-10-CM | POA: Insufficient documentation

## 2015-10-03 DIAGNOSIS — F17210 Nicotine dependence, cigarettes, uncomplicated: Secondary | ICD-10-CM | POA: Insufficient documentation

## 2015-10-03 MED ORDER — ONDANSETRON 4 MG PO TBDP
4.0000 mg | ORAL_TABLET | Freq: Once | ORAL | Status: AC
  Administered 2015-10-03: 4 mg via ORAL
  Filled 2015-10-03: qty 1

## 2015-10-03 MED ORDER — IBUPROFEN 800 MG PO TABS
800.0000 mg | ORAL_TABLET | Freq: Three times a day (TID) | ORAL | 0 refills | Status: AC | PRN
Start: 2015-10-03 — End: ?

## 2015-10-03 MED ORDER — ONDANSETRON 4 MG PO TBDP: 4.0000 mg | ORAL_TABLET | Freq: Three times a day (TID) | ORAL | 0 refills | Status: AC | PRN

## 2015-10-03 MED ORDER — IBUPROFEN 800 MG PO TABS
800.0000 mg | ORAL_TABLET | Freq: Once | ORAL | Status: AC
  Administered 2015-10-03: 800 mg via ORAL
  Filled 2015-10-03: qty 1

## 2015-10-03 MED ORDER — OXYCODONE-ACETAMINOPHEN 5-325 MG PO TABS
2.0000 | ORAL_TABLET | Freq: Once | ORAL | Status: AC
Start: 2015-10-03 — End: 2015-10-03
  Administered 2015-10-03: 2 via ORAL
  Filled 2015-10-03: qty 2

## 2015-10-03 MED ORDER — METHOCARBAMOL 500 MG PO TABS: 500.0000 mg | ORAL_TABLET | Freq: Three times a day (TID) | ORAL | 0 refills | Status: AC | PRN

## 2015-10-03 MED ORDER — OXYCODONE-ACETAMINOPHEN 5-325 MG PO TABS: 1.0000 | ORAL_TABLET | Freq: Four times a day (QID) | ORAL | 0 refills | Status: AC | PRN

## 2015-10-03 NOTE — ED Triage Notes (Signed)
Pt was playing with his nephew when he felt left "something tear" in his right shoulder, positive radial pulse

## 2015-10-03 NOTE — ED Provider Notes (Signed)
By signing my name below, I, Vista Minkobert Ross, attest that this documentation has been prepared under the direction and in the presence of Jama Mcmiller N Jesselee Poth, DO. Electronically signed, Vista Minkobert Ross, ED Scribe. 10/03/15. 11:49 PM.  TIME SEEN: 11:19 PM  CHIEF COMPLAINT: Shoulder pain  HPI:  HPI Comments: Christopher Cameron is a 30 y.o. male with no pertinent PMHx, who presents to the Emergency Department complaining of sudden onset pain to the his rightshoulder; onset tonight. Pt states he was with with his young nephew playing "karate" when he felt a tearing sensation in his right shoulder. Pt denies falling or striking the shoulder on the ground. Pt reports that the pain is exacerbated when he "drops" his right arm down. Pt further reports an intermittent shooting pain during certain movements that radiates to the back of his neck. Pt denies pain to elbow, wrist or hand.  No other injury. He is right-hand dominant. No numbness or focal weakness.  ROS: See HPI Constitutional: no fever  Eyes: no drainage  ENT: no runny nose   Cardiovascular:  no chest pain  Resp: no SOB  GI: no vomiting GU: no dysuria Integumentary: no rash  Allergy: no hives  Musculoskeletal: no leg swelling  Neurological: no slurred speech ROS otherwise negative  PAST MEDICAL HISTORY/PAST SURGICAL HISTORY:  History reviewed. No pertinent past medical history.  MEDICATIONS:  Prior to Admission medications   Medication Sig Start Date End Date Taking? Authorizing Provider  ibuprofen (ADVIL,MOTRIN) 200 MG tablet Take 400 mg by mouth every 6 (six) hours as needed for pain.    Historical Provider, MD  ibuprofen (ADVIL,MOTRIN) 600 MG tablet Take 1 tablet (600 mg total) by mouth every 8 (eight) hours as needed for pain. 11/13/12   Azalia BilisKevin Campos, MD  penicillin v potassium (VEETID) 500 MG tablet Take 1 tablet (500 mg total) by mouth 4 (four) times daily. 11/13/12   Azalia BilisKevin Campos, MD  promethazine (PHENERGAN) 25 MG tablet Take 1 tablet (25  mg total) by mouth every 6 (six) hours as needed for nausea. 05/28/12   Donnetta HutchingBrian Cook, MD  traMADol (ULTRAM) 50 MG tablet Take 1 tablet (50 mg total) by mouth every 6 (six) hours as needed for pain. 05/28/12   Donnetta HutchingBrian Cook, MD    ALLERGIES:  No Known Allergies  SOCIAL HISTORY:  Social History  Substance Use Topics  . Smoking status: Current Every Day Smoker    Types: Cigarettes  . Smokeless tobacco: Never Used  . Alcohol use No    FAMILY HISTORY: No family history on file.  EXAM: BP 134/73 (BP Location: Left Arm)   Pulse 75   Temp 98.1 F (36.7 C) (Oral)   Resp 18   Ht 6\' 1"  (1.854 m)   Wt 200 lb (90.7 kg)   SpO2 99%   BMI 26.39 kg/m  CONSTITUTIONAL: Alert and oriented and responds appropriately to questions. Appears uncomfortable, well-nourished, afebrile and nontoxic HEAD: Normocephalic EYES: Conjunctivae clear, PERRL ENT: normal nose; no rhinorrhea; moist mucous membranes NECK: Supple, no meningismus, no LAD; tender to palpation over the right trapezius muscle with spasm, no midline spinal tenderness or step-off or deformity CARD: RRR; S1 and S2 appreciated; no murmurs, no clicks, no rubs, no gallops RESP: Normal chest excursion without splinting or tachypnea; breath sounds clear and equal bilaterally; no wheezes, no rhonchi, no rales, no hypoxia or respiratory distress, speaking full sentences ABD/GI: Normal bowel sounds; non-distended; soft, non-tender, no rebound, no guarding, no peritoneal signs BACK:  The back appears  normal and is non-tender to palpation, there is no CVA tenderness EXT: TTP over the right trapezius muscle with associated muscle spasm. No deformity of right clavicle or shoulder. 2+ right radial pulse. No erythema or warmth of the right extremity. No joint effusion and compartments are soft. Otherwise extremities are non-tender to palpation. Decreased ROM in right shoulder secondary to pain. Normal ROM in all extremities otherwise. no edema; normal capillary  refill; no cyanosis, no calf tenderness or swelling    SKIN: Normal color for age and race; warm; no rash NEURO: Moves all extremities equally, sensation to light touch intact diffusely, cranial nerves II through XII intact PSYCH: The patient's mood and manner are appropriate. Grooming and personal hygiene are appropriate.  MEDICAL DECISION MAKING: Patient here with what appears to be a trapezius muscle strain, spasm. No midline spinal tenderness on exam. Neurologically intact. Extremities warm and well-perfused. No sign of gout, septic arthritis. X-ray of the right shoulder ordered in triage is unremarkable.  Have recommended ibuprofen, alternating Percocet and Robaxin. Recommended he apply ice to this area. We'll put him in a sling for comfort but have advised him to take his arm out of the sling regularly and moves his shoulder to avoid adhesive capsulitis. Discussed with him that I do not feel he needs further emergent imaging from the emergency department but if medical management does not improve his symptoms he should follow-up with orthopedic physician. Have given him Dr. Magdalene Patricia information who is on-call for orthopedics as well as two local orthopedic physicians in this area.  Have provided patient with a work note.  At this time, I do not feel there is any life-threatening condition present. I have reviewed and discussed all results (EKG, imaging, lab, urine as appropriate), exam findings with patient/family. I have reviewed nursing notes and appropriate previous records.  I feel the patient is safe to be discharged home without further emergent workup and can continue workup as an outpatient as needed. Discussed usual and customary return precautions. Patient/family verbalize understanding and are comfortable with this plan.  Outpatient follow-up has been provided. All questions have been answered.  This chart was scribed in my presence and reviewed by me personally.    Layla Maw Allexis Bordenave,  DO 10/04/15 0020

## 2015-10-04 NOTE — ED Notes (Signed)
Patient verbalizes understanding of discharge instructions, prescriptions, home care and follow up care. Patient out of department at this time. 

## 2018-01-15 IMAGING — DX DG SHOULDER 2+V*R*
3 series · 3 of 3 positions shown · non-contrast
Comparison: None.

CLINICAL DATA: Acute onset of right shoulder pain; felt something
tear in right shoulder. Initial encounter.

EXAM:
RIGHT SHOULDER - 2+ VIEW

[shoulder y view]
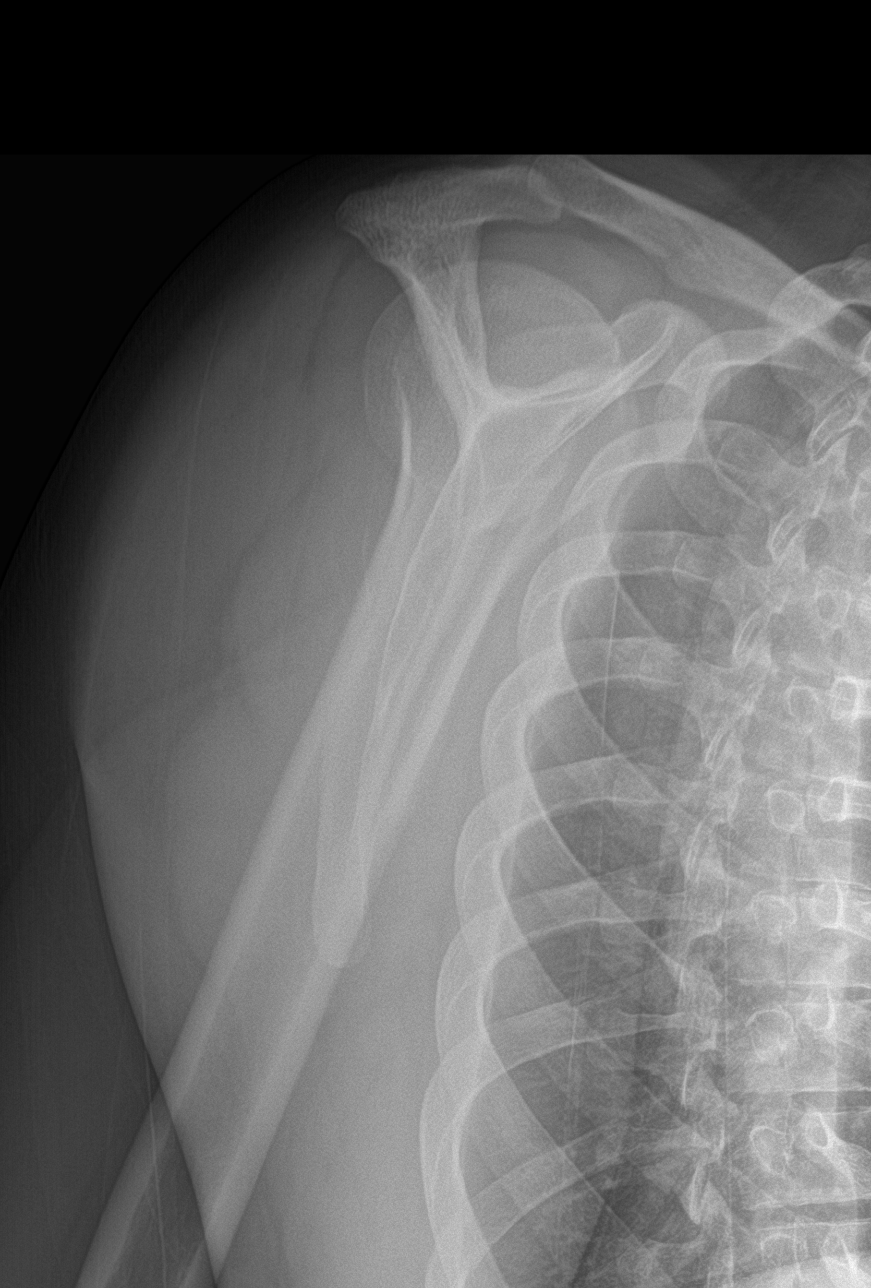

[shoulder ap neutral]
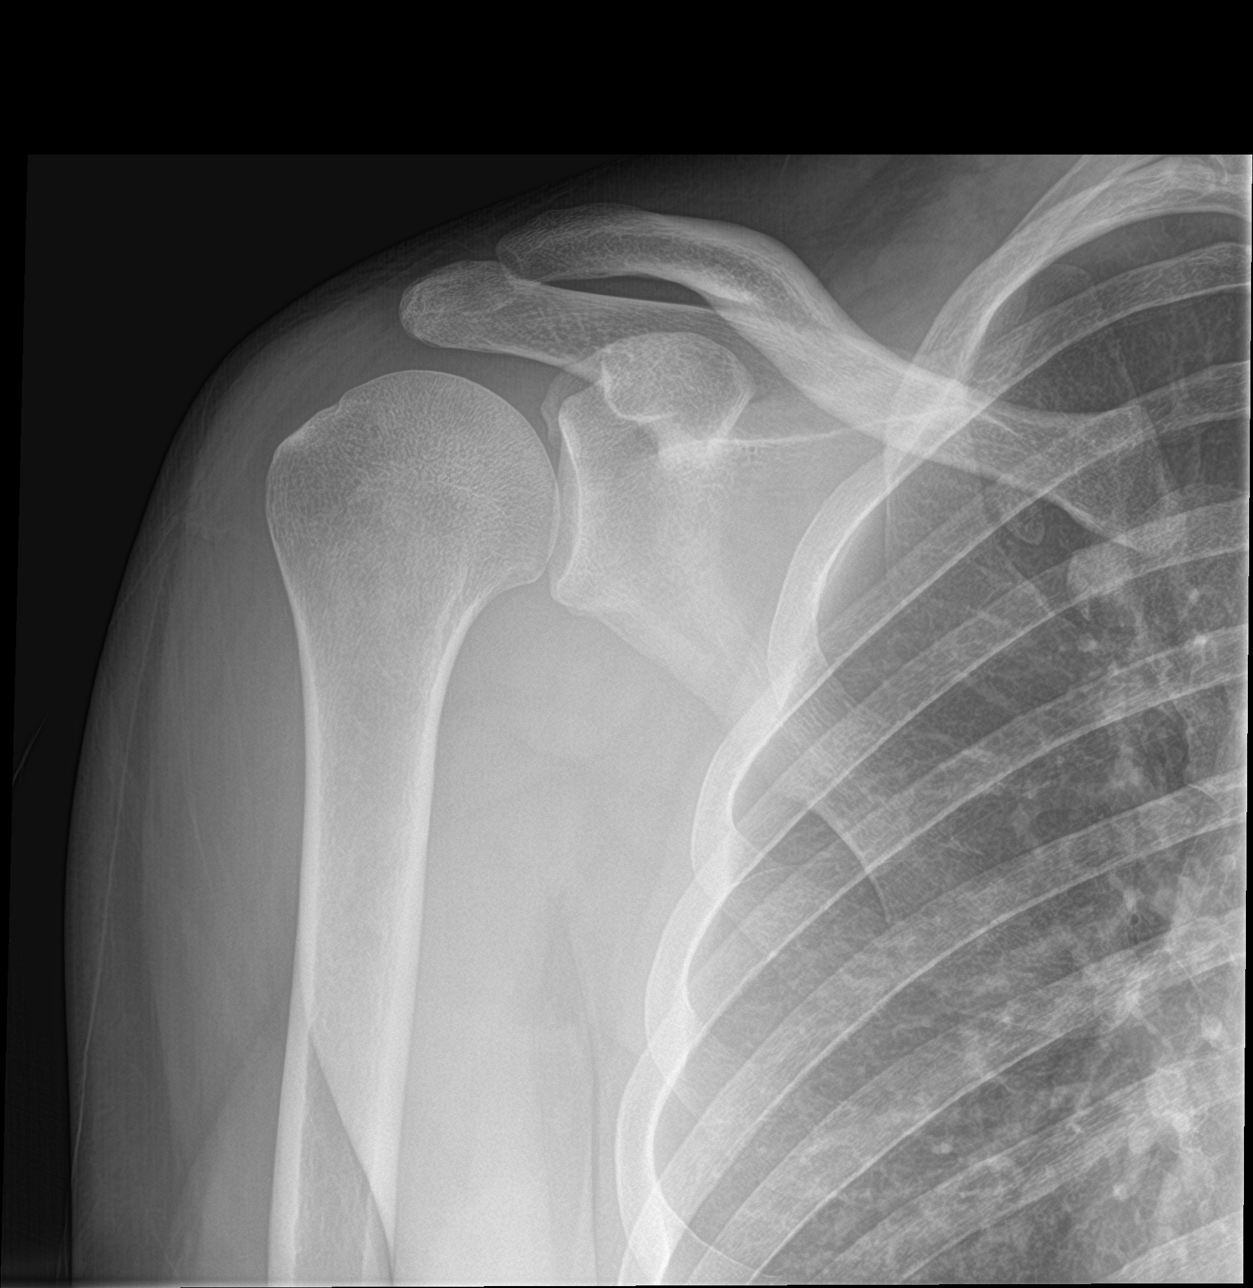

[shoulder axillary]
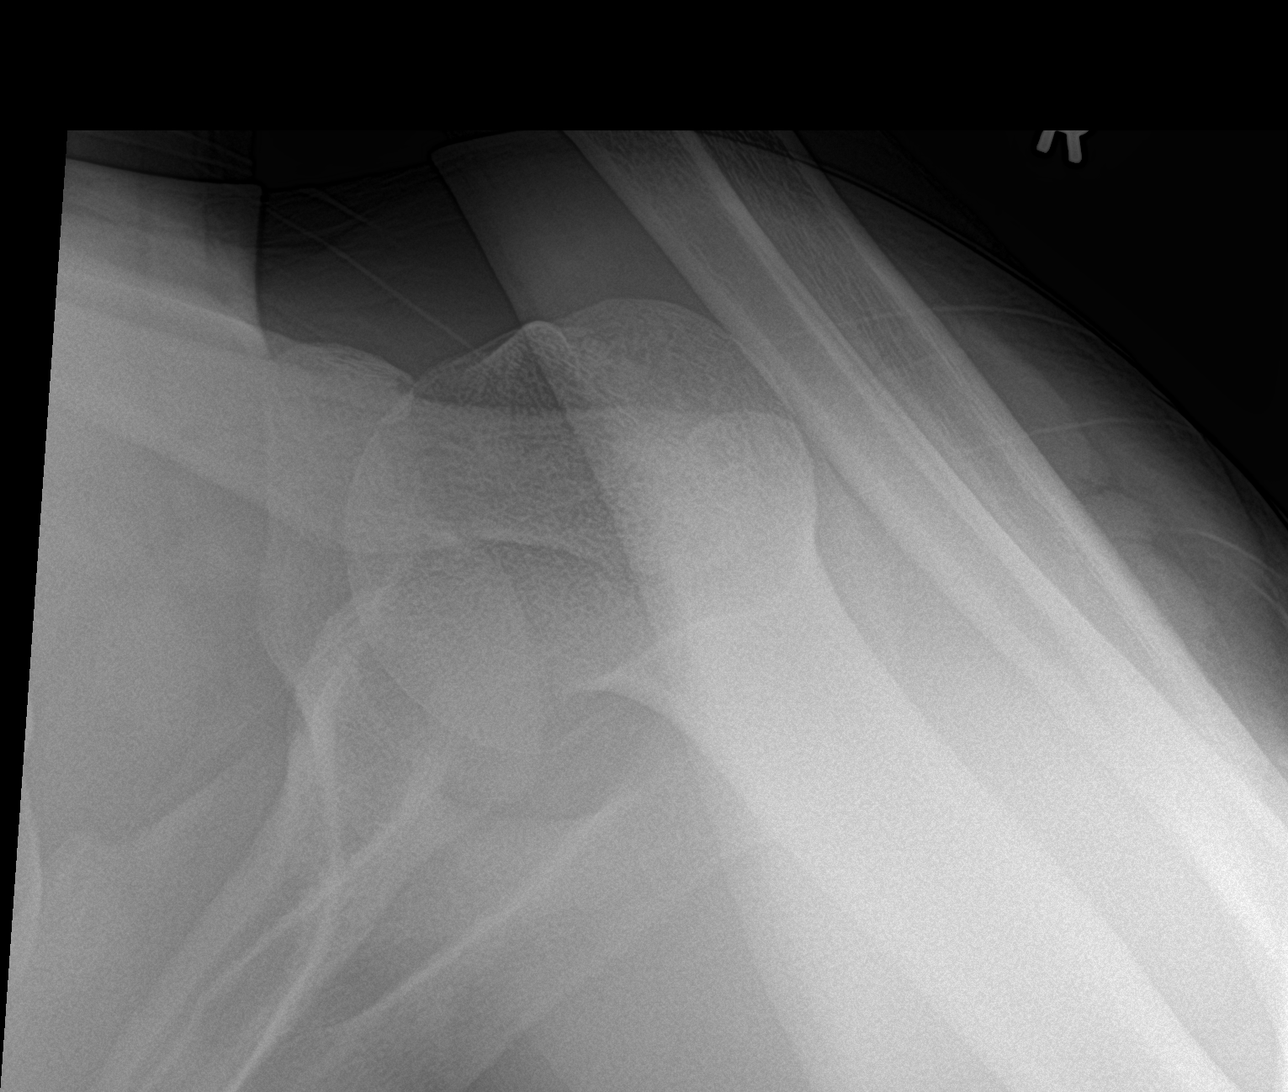

[3 of 3 positions shown; findings below may reference images not displayed]

FINDINGS: There is no evidence of fracture or dislocation. The right humeral
head is seated within the glenoid fossa. The acromioclavicular joint
is unremarkable in appearance. No significant soft tissue
abnormalities are seen. The visualized portions of the right lung
are clear.
IMPRESSION: No evidence of fracture or dislocation. If the patient's symptoms
persist, MRI could be considered for further evaluation.
